# Patient Record
Sex: Female | Born: 1995 | Race: White | Hispanic: No | Marital: Single | Smoking: Former smoker
Health system: Southern US, Community
[De-identification: ages and names within clinical notes are randomized; demographics above are authoritative.]

## PROBLEM LIST (undated history)

## (undated) DIAGNOSIS — A749 Chlamydial infection, unspecified: Secondary | ICD-10-CM

## (undated) DIAGNOSIS — E049 Nontoxic goiter, unspecified: Secondary | ICD-10-CM

## (undated) DIAGNOSIS — F99 Mental disorder, not otherwise specified: Secondary | ICD-10-CM

## (undated) DIAGNOSIS — E063 Autoimmune thyroiditis: Secondary | ICD-10-CM

## (undated) DIAGNOSIS — F3281 Premenstrual dysphoric disorder: Secondary | ICD-10-CM

## (undated) DIAGNOSIS — F32A Depression, unspecified: Secondary | ICD-10-CM

## (undated) DIAGNOSIS — N946 Dysmenorrhea, unspecified: Secondary | ICD-10-CM

## (undated) DIAGNOSIS — S42309A Unspecified fracture of shaft of humerus, unspecified arm, initial encounter for closed fracture: Secondary | ICD-10-CM

## (undated) DIAGNOSIS — F329 Major depressive disorder, single episode, unspecified: Secondary | ICD-10-CM

## (undated) HISTORY — DX: Dysmenorrhea, unspecified: N94.6

## (undated) HISTORY — DX: Premenstrual dysphoric disorder: F32.81

## (undated) HISTORY — DX: Autoimmune thyroiditis: E06.3

## (undated) HISTORY — PX: WISDOM TOOTH EXTRACTION: SHX21

## (undated) HISTORY — DX: Unspecified fracture of shaft of humerus, unspecified arm, initial encounter for closed fracture: S42.309A

## (undated) HISTORY — DX: Nontoxic goiter, unspecified: E04.9

## (undated) HISTORY — DX: Chlamydial infection, unspecified: A74.9

## (undated) HISTORY — DX: Mental disorder, not otherwise specified: F99

---
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
## 2015-07-26 ENCOUNTER — Encounter (HOSPITAL_COMMUNITY): Payer: Self-pay

## 2015-07-26 ENCOUNTER — Emergency Department (HOSPITAL_COMMUNITY): Payer: PRIVATE HEALTH INSURANCE

## 2015-07-26 DIAGNOSIS — Z8742 Personal history of other diseases of the female genital tract: Secondary | ICD-10-CM | POA: Insufficient documentation

## 2015-07-26 DIAGNOSIS — F17210 Nicotine dependence, cigarettes, uncomplicated: Secondary | ICD-10-CM | POA: Diagnosis not present

## 2015-07-26 DIAGNOSIS — Z8781 Personal history of (healed) traumatic fracture: Secondary | ICD-10-CM | POA: Diagnosis not present

## 2015-07-26 DIAGNOSIS — M542 Cervicalgia: Secondary | ICD-10-CM | POA: Insufficient documentation

## 2015-07-26 DIAGNOSIS — J9801 Acute bronchospasm: Secondary | ICD-10-CM | POA: Diagnosis not present

## 2015-07-26 DIAGNOSIS — Z8619 Personal history of other infectious and parasitic diseases: Secondary | ICD-10-CM | POA: Insufficient documentation

## 2015-07-26 DIAGNOSIS — Z8639 Personal history of other endocrine, nutritional and metabolic disease: Secondary | ICD-10-CM | POA: Diagnosis not present

## 2015-07-26 DIAGNOSIS — Z8659 Personal history of other mental and behavioral disorders: Secondary | ICD-10-CM | POA: Diagnosis not present

## 2015-07-26 DIAGNOSIS — Z79899 Other long term (current) drug therapy: Secondary | ICD-10-CM | POA: Diagnosis not present

## 2015-07-26 DIAGNOSIS — M549 Dorsalgia, unspecified: Secondary | ICD-10-CM | POA: Insufficient documentation

## 2015-07-26 DIAGNOSIS — R079 Chest pain, unspecified: Secondary | ICD-10-CM | POA: Diagnosis present

## 2015-07-26 LAB — BASIC METABOLIC PANEL WITH GFR
Anion gap: 10 (ref 5–15)
BUN: 7 mg/dL (ref 6–20)
CO2: 25 mmol/L (ref 22–32)
Calcium: 9.5 mg/dL (ref 8.9–10.3)
Chloride: 102 mmol/L (ref 101–111)
Creatinine, Ser: 0.65 mg/dL (ref 0.44–1.00)
GFR calc Af Amer: >60 mL/min (ref >60)
GFR calc non Af Amer: >60 mL/min (ref >60)
Glucose, Bld: 77 mg/dL (ref 65–99)
Potassium: 3.4 mmol/L — ABNORMAL LOW (ref 3.5–5.1)
Sodium: 137 mmol/L (ref 135–145)

## 2015-07-26 LAB — I-STAT TROPONIN, ED
Troponin i, poc: 0 ng/mL (ref 0.00–0.08)
Troponin i, poc: 0 ng/mL (ref 0.00–0.08)

## 2015-07-26 LAB — CBC
HCT: 38 % (ref 36.0–46.0)
Hemoglobin: 12.7 g/dL (ref 12.0–15.0)
MCH: 31.1 pg (ref 26.0–34.0)
MCHC: 33.4 g/dL (ref 30.0–36.0)
MCV: 93.1 fL (ref 78.0–100.0)
Platelets: 163 10*3/uL (ref 150–400)
RBC: 4.08 MIL/uL (ref 3.87–5.11)
RDW: 12.6 % (ref 11.5–15.5)
WBC: 7.6 10*3/uL (ref 4.0–10.5)

## 2015-07-26 NOTE — ED Notes (Signed)
Pt reports was seen at u/c 2 days ago for sinus, sore throat, fatigue, mild cough, chest pain and back pain.  Pt was given antibiotic but did not take because she was feeling better until today.  Pt still with sore throat, chest pain and back pain.

## 2015-07-27 ENCOUNTER — Emergency Department (HOSPITAL_COMMUNITY)
Admission: EM | Admit: 2015-07-27 | Discharge: 2015-07-27 | Disposition: A | Discharge status: Home / Self Care | Payer: PRIVATE HEALTH INSURANCE | Attending: Emergency Medicine | Admitting: Emergency Medicine

## 2015-07-27 DIAGNOSIS — J9801 Acute bronchospasm: Secondary | ICD-10-CM

## 2015-07-27 DIAGNOSIS — R079 Chest pain, unspecified: Secondary | ICD-10-CM

## 2015-07-27 LAB — D-DIMER, QUANTITATIVE: D-Dimer, Quant: 0.32 ug/mL-FEU (ref 0.00–0.50)

## 2015-07-27 MED ORDER — ALBUTEROL SULFATE HFA 108 (90 BASE) MCG/ACT IN AERS: 2.0000 | INHALATION_SPRAY | RESPIRATORY_TRACT | Status: AC | PRN

## 2015-07-27 MED ORDER — TRAMADOL HCL 50 MG PO TABS: 50.0000 mg | ORAL_TABLET | Freq: Four times a day (QID) | ORAL | Status: AC | PRN

## 2015-07-27 MED ORDER — PREDNISONE 20 MG PO TABS: 60.0000 mg | ORAL_TABLET | Freq: Every day | ORAL | Status: AC

## 2015-07-27 NOTE — ED Provider Notes (Signed)
CSN: [id number]     Arrival date & time 07/26/15  2154 History  By signing my name below, I, Doreatha Martin, attest that this documentation has been prepared under the direction and in the presence of Gilda Crease, MD. Electronically Signed: Doreatha Martin, ED Scribe. 07/27/2015. 1:13 AM.    Chief Complaint  Patient presents with  . Chest Pain   The history is provided by the patient. No language interpreter was used.   HPI Comments: Allison Dickson is a 20 y.o. female who presents to the Emergency Department complaining of left-sided and substernal CP onset this week with associated upper back and neck pain, sore throat. Pt states that her CP is worsened with deep inhalation and movement. No known trauma, falls or injury to the chest wall. She was seen at UC 2 days ago for the same symptoms and was prescribed Amoxicillin. Per pt, she did not take the medication because she believed her symptoms were improving. She denies cough, SOB, difficulty tolerating secretions. No h/o asthma. Pt is a current daily smoker.   Past Medical History  Diagnosis Date  . Mental disorder     depression and anxiety  . Vitamin D deficiency   . Dysmenorrhea   . Chlamydia   . Arm fracture     right arm  . Goiter   . PMDD (premenstrual dysphoric disorder)   . Hashimoto's disease    Past Surgical History  Procedure Laterality Date  . Wisdom tooth extraction     Family History  Problem Relation Age of Onset  . Hypertension Father   . Thyroid disease Mother    Social History  Substance Use Topics  . Smoking status: Current Every Day Smoker    Types: Cigarettes  . Smokeless tobacco: None     Comment: 1/2 pack a day  . Alcohol Use: No   OB History    Gravida Para Term Preterm AB TAB SAB Ectopic Multiple Living   0         0     Review of Systems  HENT: Positive for sore throat.   Respiratory: Negative for cough and shortness of breath.   Cardiovascular: Positive for chest pain.   Musculoskeletal: Positive for back pain and neck pain.  All other systems reviewed and are negative.  Allergies  Review of patient's allergies indicates no known allergies.  Home Medications   Prior to Admission medications   Medication Sig Start Date End Date Taking? Authorizing Provider  acetaminophen (TYLENOL) 325 MG tablet Take 650 mg by mouth every 6 (six) hours as needed for mild pain.   Yes Historical Provider, MD  sertraline (ZOLOFT) 100 MG tablet Take 100 mg by mouth daily.   Yes Historical Provider, MD  albuterol (PROVENTIL HFA;VENTOLIN HFA) 108 (90 Base) MCG/ACT inhaler Inhale 2 puffs into the lungs every 4 (four) hours as needed for wheezing or shortness of breath. 07/27/15   Gilda Crease, MD  etonogestrel (IMPLANON) 68 MG IMPL implant Inject 1 each (68 mg total) into the skin once. Patient not taking: Reported on 07/27/2015 07/30/11 07/27/15  Henreitta Leber, PA-C  predniSONE (DELTASONE) 20 MG tablet Take 3 tablets (60 mg total) by mouth daily with breakfast. 07/27/15   Gilda Crease, MD  traMADol (ULTRAM) 50 MG tablet Take 1 tablet (50 mg total) by mouth every 6 (six) hours as needed. 07/27/15   Gilda Crease, MD   BP 117/82 mmHg  Pulse 93  Temp(Src) 98.3 F (36.8 C) (  Oral)  Resp 17  Ht 5\' 6"  (1.676 m)  Wt 165 lb (74.844 kg)  BMI 26.64 kg/m2  SpO2 100%  LMP 06/29/2015 Physical Exam  Constitutional: She is oriented to person, place, and time. She appears well-developed and well-nourished. No distress.  HENT:  Head: Normocephalic and atraumatic.  Right Ear: Hearing normal.  Left Ear: Hearing normal.  Nose: Nose normal.  Mouth/Throat: Oropharynx is clear and moist and mucous membranes are normal.  Eyes: Conjunctivae and EOM are normal. Pupils are equal, round, and reactive to light.  Neck: Normal range of motion. Neck supple.  Cardiovascular: Regular rhythm.  Exam reveals no gallop and no friction rub.   No murmur heard. Pulmonary/Chest: Effort  normal. No respiratory distress. She exhibits no tenderness.  Decreased breath sounds bilaterally. No wheezing appreciated.   Abdominal: Soft. Normal appearance and bowel sounds are normal. There is no hepatosplenomegaly. There is no tenderness. There is no rebound, no guarding, no tenderness at McBurney's point and negative Murphy's sign. No hernia.  Musculoskeletal: Normal range of motion.  Neurological: She is alert and oriented to person, place, and time. She has normal strength. No cranial nerve deficit or sensory deficit. Coordination normal. GCS eye subscore is 4. GCS verbal subscore is 5. GCS motor subscore is 6.  Skin: Skin is warm and dry. No rash noted.  Psychiatric: She has a normal mood and affect. Her behavior is normal. Thought content normal.  Nursing note and vitals reviewed.   ED Course  Procedures (including critical care time) DIAGNOSTIC STUDIES: Oxygen Saturation is 100% on RA, normal by my interpretation.    COORDINATION OF CARE: 1:11 AM Discussed treatment plan with pt at bedside which includes CXR, EKG, lab work and pt agreed to plan.   Labs Review Labs Reviewed  BASIC METABOLIC PANEL - Abnormal; Notable for the following:    Potassium 3.4 (*)    All other components within normal limits  CBC  D-DIMER, QUANTITATIVE (NOT AT [facility])  I-STAT TROPOININ, ED  I-STAT TROPOININ, ED    Imaging Review Dg Chest 2 View  07/26/2015  CLINICAL DATA:  Acute onset of sore throat, fatigue, mild cough and generalized chest pain. Back pain. Initial encounter. EXAM: CHEST  2 VIEW COMPARISON:  None. FINDINGS: The lungs are well-aerated and clear. There is no evidence of focal opacification, pleural effusion or pneumothorax. The heart is normal in size; the mediastinal contour is within normal limits. No acute osseous abnormalities are seen. IMPRESSION: No acute cardiopulmonary process seen. Electronically Signed   By: Roanna RaiderJeffery  Chang M.D.   On: 07/26/2015 23:50   I have personally  reviewed and evaluated these images and lab results as part of my medical decision-making.   EKG Interpretation   Date/Time:  Saturday Jul 26 2015 22:00:06 EDT Ventricular Rate:  104 PR Interval:  138 QRS Duration: 80 QT Interval:  316 QTC Calculation: 415 R Axis:   68 Text Interpretation:  Sinus tachycardia Otherwise normal ECG Confirmed by  Lona Six  MD, Kyley Laurel ([id number]) on 07/27/2015 1:15:23 AM      MDM   Final diagnoses:  Chest pain, unspecified chest pain type  Bronchospasm   Patient presents to emergency for evaluation of chest pain. Pain goes into her back at times and worsens when she takes a deep breath. She has, however, also noticed pain with movements. She was seen at urgent care several days ago and started on amoxicillin for presumed upper respiratory infection. She has not had much coughing, but she did  have significantly diminished breath sounds here in the ER. She is a smoker and I suspect that there is some bronchospastic component to the symptoms. She is not hypoxic. She was mildly tachycardic on arrival, PE was considered although felt to be less likely than other etiology. D-dimer was negative which is felt to be good evidence that she is not experiencing probable embolic event at this time and patient will be treated with prednisone, albuterol, analgesia and rest.   I personally performed the services described in this documentation, which was scribed in my presence. The recorded information has been reviewed and is accurate.   Gilda Crease, MD 07/27/15 [phone]

## 2015-07-27 NOTE — Discharge Instructions (Signed)
Bronchospasm, Adult  A bronchospasm is a spasm or tightening of the airways going into the lungs. During a bronchospasm breathing becomes more difficult because the airways get smaller. When this happens there can be coughing, a whistling sound when breathing (wheezing), and difficulty breathing. Bronchospasm is often associated with asthma, but not all patients who experience a bronchospasm have asthma.  CAUSES   A bronchospasm is caused by inflammation or irritation of the airways. The inflammation or irritation may be triggered by:   · Allergies (such as to animals, pollen, food, or mold). Allergens that cause bronchospasm may cause wheezing immediately after exposure or many hours later.    · Infection. Viral infections are believed to be the most common cause of bronchospasm.    · Exercise.    · Irritants (such as pollution, cigarette smoke, strong odors, aerosol sprays, and paint fumes).    · Weather changes. Winds increase molds and pollens in the air. Rain refreshes the air by washing irritants out. Cold air may cause inflammation.    · Stress and emotional upset.    SIGNS AND SYMPTOMS   · Wheezing.    · Excessive nighttime coughing.    · Frequent or severe coughing with a simple cold.    · Chest tightness.    · Shortness of breath.    DIAGNOSIS   Bronchospasm is usually diagnosed through a history and physical exam. Tests, such as chest X-rays, are sometimes done to look for other conditions.  TREATMENT   · Inhaled medicines can be given to open up your airways and help you breathe. The medicines can be given using either an inhaler or a nebulizer machine.  · Corticosteroid medicines may be given for severe bronchospasm, usually when it is associated with asthma.  HOME CARE INSTRUCTIONS   · Always have a plan prepared for seeking medical care. Know when to call your health care provider and local emergency services (911 in the [place] Know where you can access local emergency care.  · Only take medicines as  directed by your health care provider.  · If you were prescribed an inhaler or nebulizer machine, ask your health care provider to explain how to use it correctly. Always use a spacer with your inhaler if you were given one.  · It is necessary to remain calm during an attack. Try to relax and breathe more slowly.   · Control your home environment in the following ways:      Change your heating and air conditioning filter at least once a month.      Limit your use of fireplaces and wood stoves.    Do not smoke and do not allow smoking in your home.      Avoid exposure to perfumes and fragrances.      Get rid of pests (such as roaches and mice) and their droppings.      Throw away plants if you see mold on them.      Keep your house clean and dust free.      Replace carpet with wood, tile, or vinyl flooring. Carpet can trap dander and dust.      Use allergy-proof pillows, mattress covers, and box spring covers.      Wash bed sheets and blankets every week in hot water and dry them in a dryer.      Use blankets that are made of polyester or cotton.      Wash hands frequently.  SEEK MEDICAL CARE IF:   · You have muscle aches.    · You have chest pain.    · The sputum changes from clear or   white to yellow, green, gray, or bloody.    · The sputum you cough up gets thicker.    · There are problems that may be related to the medicine you are given, such as a rash, itching, swelling, or trouble breathing.    SEEK IMMEDIATE MEDICAL CARE IF:   · You have worsening wheezing and coughing even after taking your prescribed medicines.    · You have increased difficulty breathing.    · You develop severe chest pain.  MAKE SURE YOU:   · Understand these instructions.  · Will watch your condition.  · Will get help right away if you are not doing well or get worse.     This information is not intended to replace advice given to you by your health care provider. Make sure you discuss any questions you have with your health care  provider.     Document Released: 02/25/2003 Document Revised: 03/15/2014 Document Reviewed: 08/14/2012  Elsevier Interactive Patient Education ©2016 [employer]

## 2015-12-05 ENCOUNTER — Encounter (HOSPITAL_COMMUNITY): Payer: Self-pay | Admitting: Emergency Medicine

## 2015-12-05 ENCOUNTER — Emergency Department (HOSPITAL_COMMUNITY)
Admission: EM | Admit: 2015-12-05 | Discharge: 2015-12-05 | Disposition: A | Discharge status: Home / Self Care | Payer: PRIVATE HEALTH INSURANCE | Attending: Emergency Medicine | Admitting: Emergency Medicine

## 2015-12-05 DIAGNOSIS — Y999 Unspecified external cause status: Secondary | ICD-10-CM | POA: Diagnosis not present

## 2015-12-05 DIAGNOSIS — S199XXA Unspecified injury of neck, initial encounter: Secondary | ICD-10-CM | POA: Diagnosis present

## 2015-12-05 DIAGNOSIS — F17210 Nicotine dependence, cigarettes, uncomplicated: Secondary | ICD-10-CM | POA: Insufficient documentation

## 2015-12-05 DIAGNOSIS — Y92410 Unspecified street and highway as the place of occurrence of the external cause: Secondary | ICD-10-CM | POA: Insufficient documentation

## 2015-12-05 DIAGNOSIS — Y939 Activity, unspecified: Secondary | ICD-10-CM | POA: Insufficient documentation

## 2015-12-05 DIAGNOSIS — S161XXA Strain of muscle, fascia and tendon at neck level, initial encounter: Secondary | ICD-10-CM | POA: Diagnosis not present

## 2015-12-05 HISTORY — DX: Major depressive disorder, single episode, unspecified: F32.9

## 2015-12-05 HISTORY — DX: Depression, unspecified: F32.A

## 2015-12-05 MED ORDER — IBUPROFEN 800 MG PO TABS: 800.0000 mg | ORAL_TABLET | Freq: Three times a day (TID) | ORAL | 0 refills | Status: AC

## 2015-12-05 MED ORDER — CYCLOBENZAPRINE HCL 10 MG PO TABS: 10.0000 mg | ORAL_TABLET | Freq: Two times a day (BID) | ORAL | 0 refills | Status: AC | PRN

## 2015-12-05 NOTE — ED Notes (Signed)
Pt verbalized understanding of d/c instructions and no further questions. Pt sent home with cervical soft collar. Pt ambulatory and NAD. VSS
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
## 2015-12-05 NOTE — ED Notes (Signed)
Called Pt for room. No Response.

## 2017-03-19 IMAGING — CR DG CHEST 2V
2 series · 2 of 2 positions shown · non-contrast
Comparison: None.

CLINICAL DATA: Acute onset of sore throat, fatigue, mild cough and
generalized chest pain. Back pain. Initial encounter.

EXAM:
CHEST  2 VIEW

[chest pa]
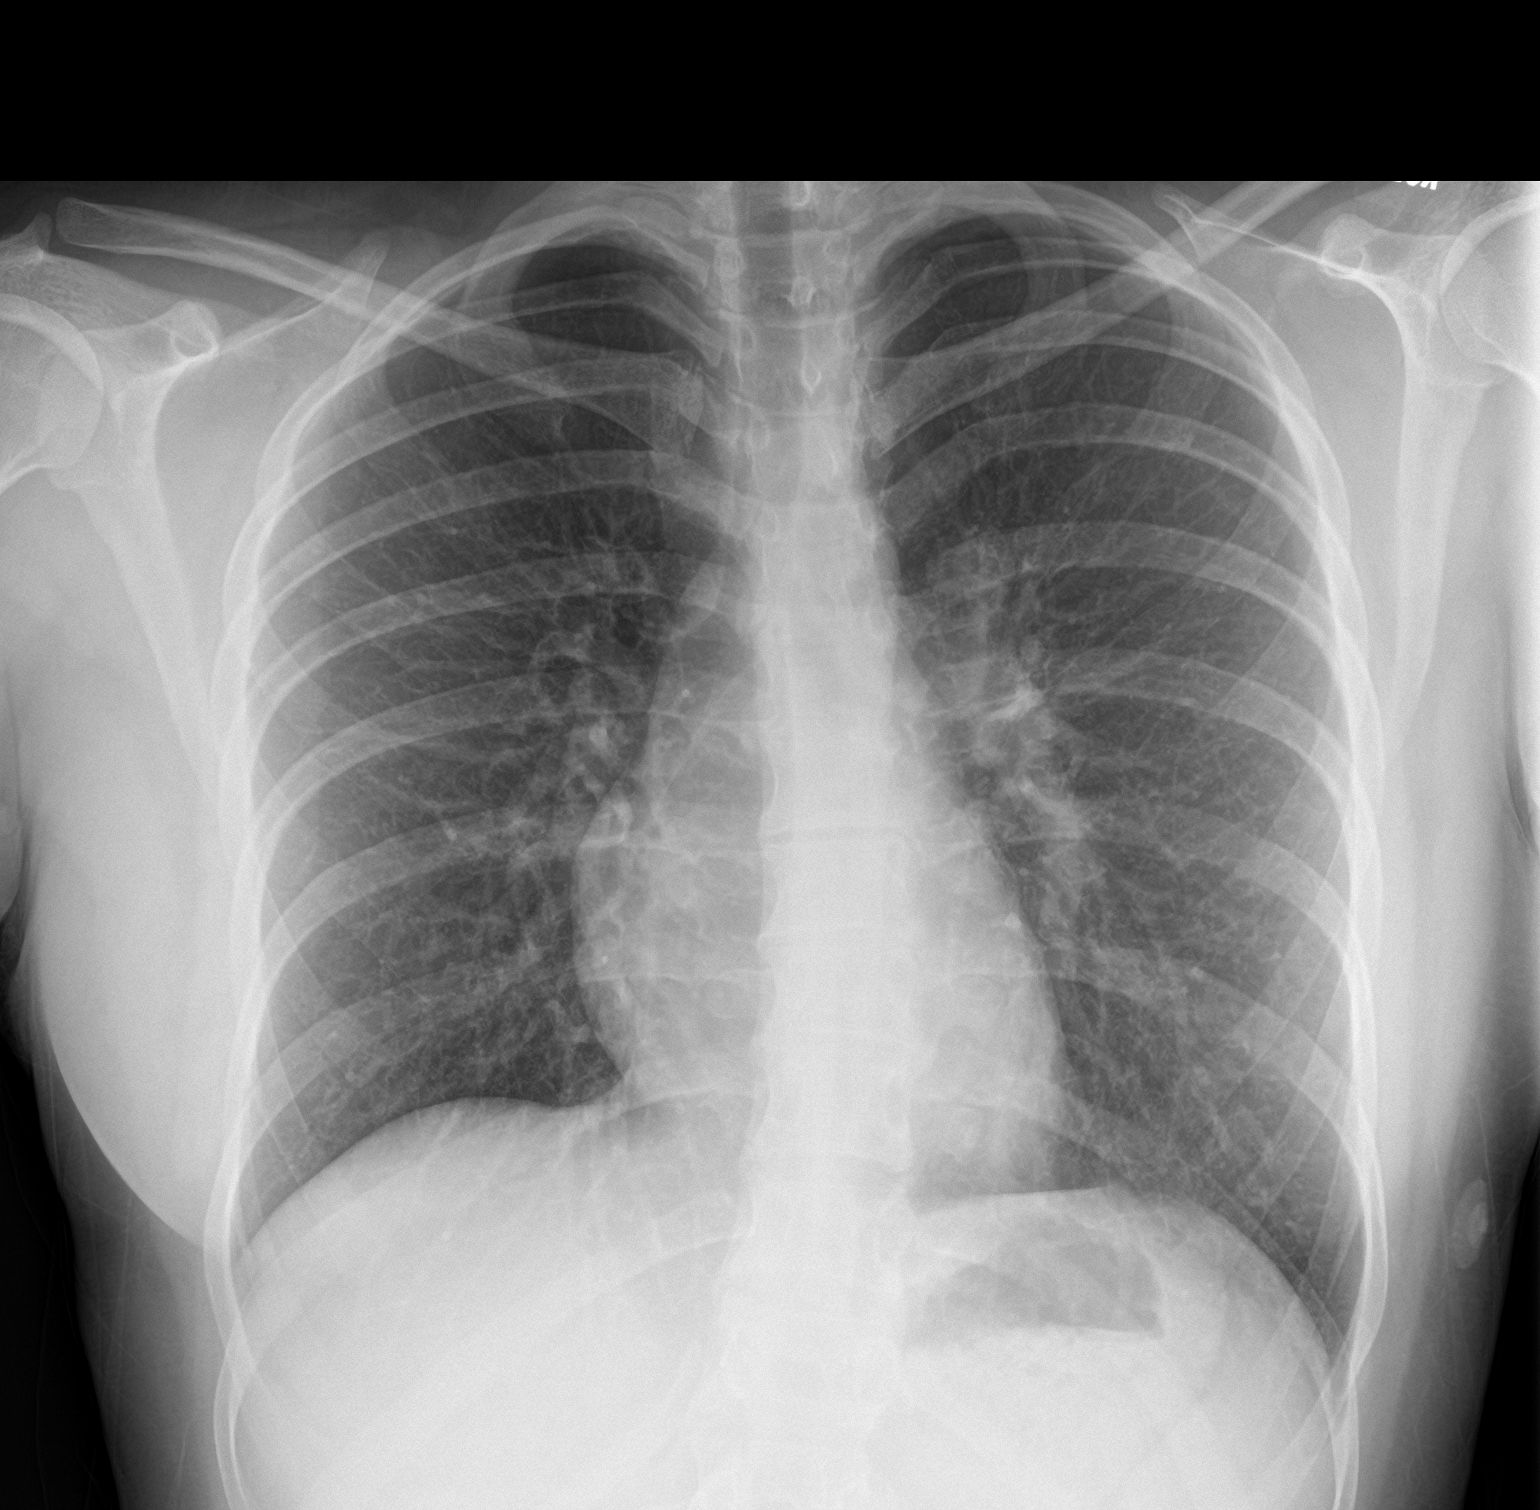

[chest lat]
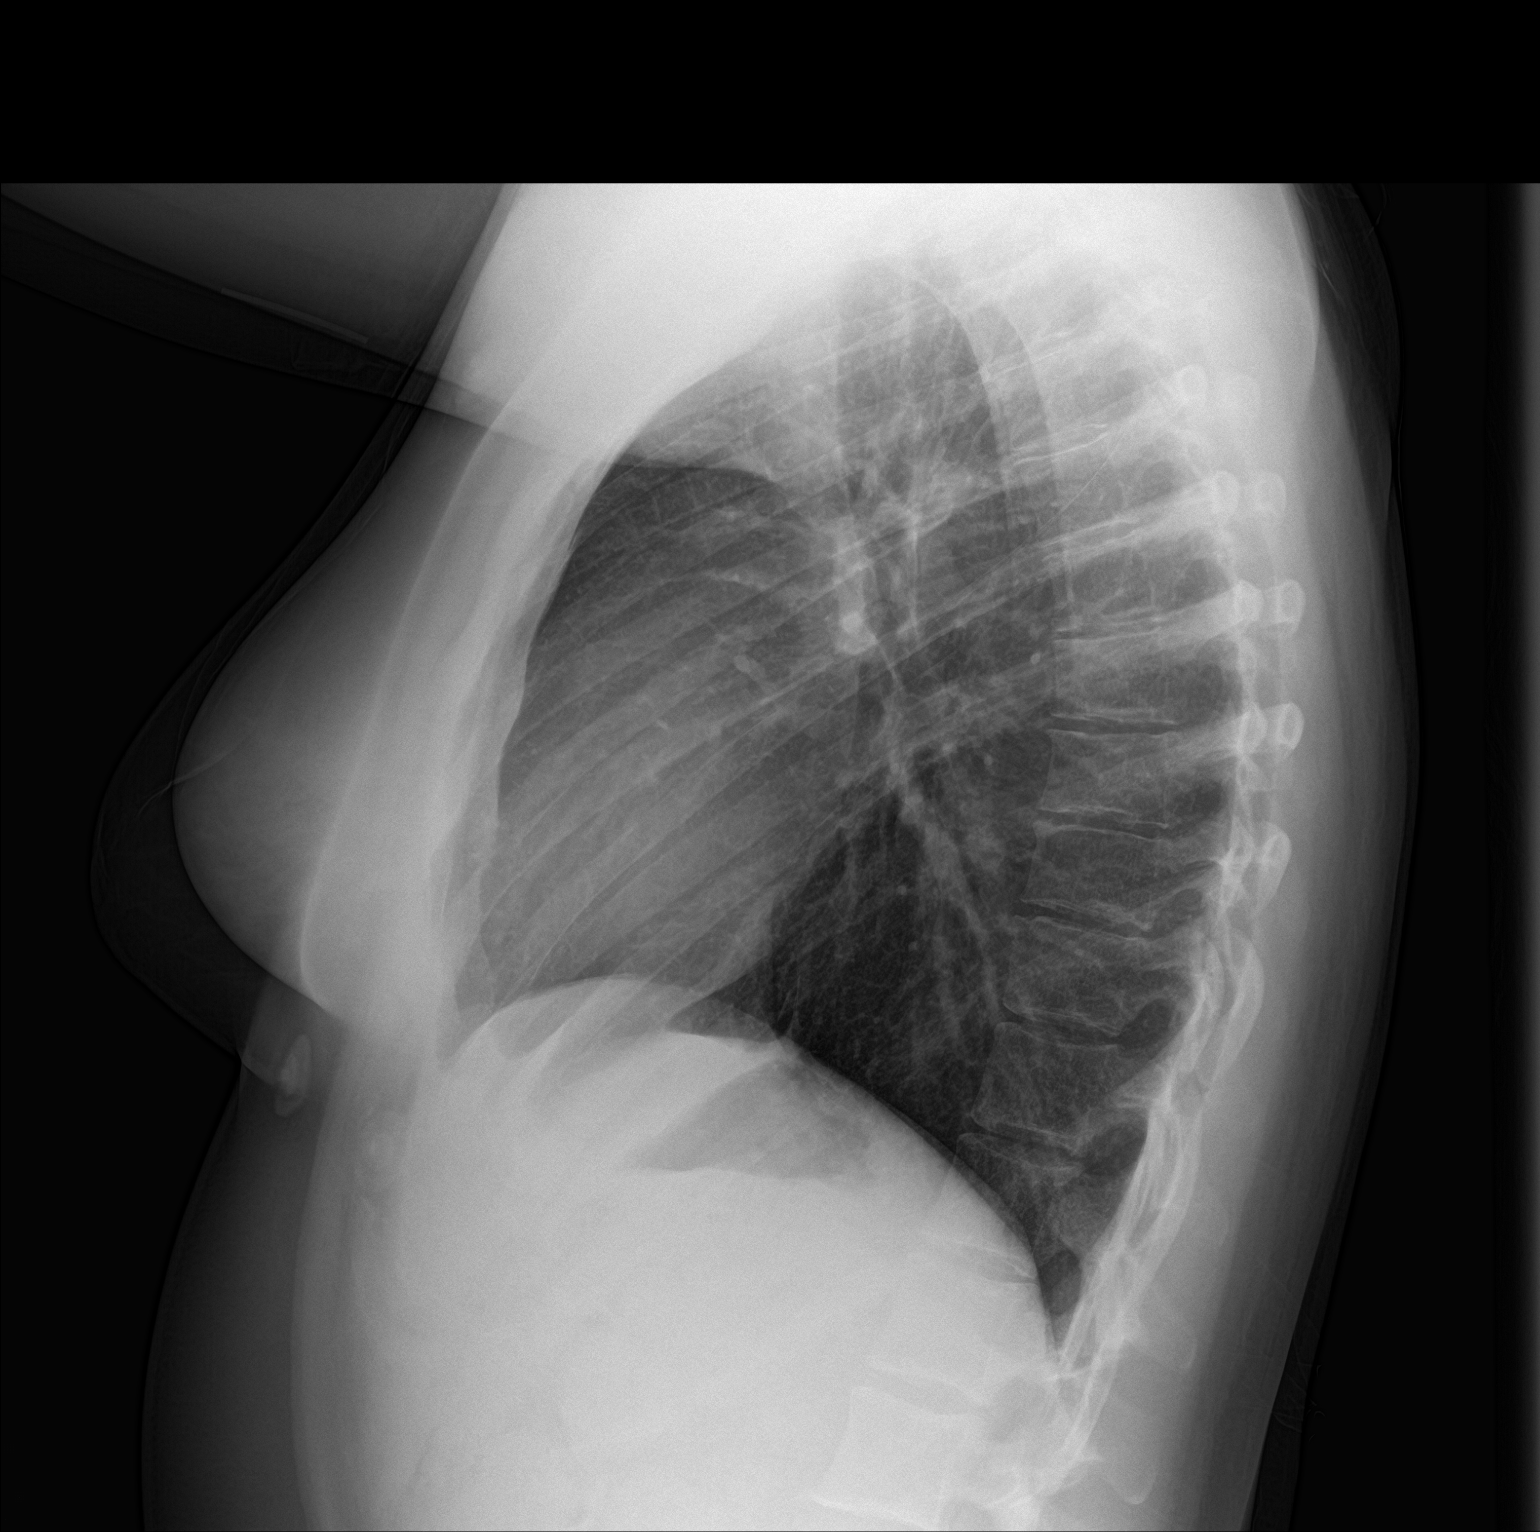

[2 of 2 positions shown; findings below may reference images not displayed]

FINDINGS: The lungs are well-aerated and clear. There is no evidence of focal
opacification, pleural effusion or pneumothorax.

The heart is normal in size; the mediastinal contour is within
normal limits. No acute osseous abnormalities are seen.
IMPRESSION: No acute cardiopulmonary process seen.

## 2017-03-22 DIAGNOSIS — Z13220 Encounter for screening for lipoid disorders: Secondary | ICD-10-CM | POA: Diagnosis not present

## 2017-03-22 DIAGNOSIS — F339 Major depressive disorder, recurrent, unspecified: Secondary | ICD-10-CM | POA: Diagnosis not present

## 2017-03-22 DIAGNOSIS — R5383 Other fatigue: Secondary | ICD-10-CM | POA: Diagnosis not present

## 2017-03-22 DIAGNOSIS — E559 Vitamin D deficiency, unspecified: Secondary | ICD-10-CM | POA: Diagnosis not present

## 2017-03-22 DIAGNOSIS — F411 Generalized anxiety disorder: Secondary | ICD-10-CM | POA: Diagnosis not present

## 2017-03-22 DIAGNOSIS — Z8639 Personal history of other endocrine, nutritional and metabolic disease: Secondary | ICD-10-CM | POA: Diagnosis not present

## 2017-03-22 DIAGNOSIS — Z131 Encounter for screening for diabetes mellitus: Secondary | ICD-10-CM | POA: Diagnosis not present

## 2017-06-29 DIAGNOSIS — Z01419 Encounter for gynecological examination (general) (routine) without abnormal findings: Secondary | ICD-10-CM | POA: Diagnosis not present

## 2017-06-29 DIAGNOSIS — Z113 Encounter for screening for infections with a predominantly sexual mode of transmission: Secondary | ICD-10-CM | POA: Diagnosis not present

## 2017-06-29 DIAGNOSIS — Z6829 Body mass index (BMI) 29.0-29.9, adult: Secondary | ICD-10-CM | POA: Diagnosis not present

## 2017-06-29 DIAGNOSIS — N760 Acute vaginitis: Secondary | ICD-10-CM | POA: Diagnosis not present

## 2017-11-15 DIAGNOSIS — E559 Vitamin D deficiency, unspecified: Secondary | ICD-10-CM | POA: Diagnosis not present

## 2017-11-15 DIAGNOSIS — E7800 Pure hypercholesterolemia, unspecified: Secondary | ICD-10-CM | POA: Diagnosis not present

## 2017-11-15 DIAGNOSIS — R5383 Other fatigue: Secondary | ICD-10-CM | POA: Diagnosis not present

## 2017-11-15 DIAGNOSIS — F339 Major depressive disorder, recurrent, unspecified: Secondary | ICD-10-CM | POA: Diagnosis not present

## 2017-11-15 DIAGNOSIS — F411 Generalized anxiety disorder: Secondary | ICD-10-CM | POA: Diagnosis not present

## 2020-03-14 ENCOUNTER — Other Ambulatory Visit: Payer: PRIVATE HEALTH INSURANCE

## 2022-08-24 ENCOUNTER — Other Ambulatory Visit: Payer: Self-pay

## 2022-08-24 ENCOUNTER — Encounter (HOSPITAL_BASED_OUTPATIENT_CLINIC_OR_DEPARTMENT_OTHER): Payer: Self-pay

## 2022-08-24 ENCOUNTER — Emergency Department (HOSPITAL_BASED_OUTPATIENT_CLINIC_OR_DEPARTMENT_OTHER)
Admission: EM | Admit: 2022-08-24 | Discharge: 2022-08-24 | Disposition: A | Discharge status: Home / Self Care | Payer: PRIVATE HEALTH INSURANCE | Attending: Emergency Medicine | Admitting: Emergency Medicine

## 2022-08-24 DIAGNOSIS — Z203 Contact with and (suspected) exposure to rabies: Secondary | ICD-10-CM

## 2022-08-24 DIAGNOSIS — Z2914 Encounter for prophylactic rabies immune globin: Secondary | ICD-10-CM | POA: Insufficient documentation

## 2022-08-24 DIAGNOSIS — Z23 Encounter for immunization: Secondary | ICD-10-CM | POA: Diagnosis not present

## 2022-08-24 MED ORDER — RABIES VACCINE, PCEC IM SUSR
1.0000 mL | Freq: Once | INTRAMUSCULAR | Status: AC
  Administered 2022-08-24: 1 mL via INTRAMUSCULAR
  Filled 2022-08-24: qty 1

## 2022-08-24 MED ORDER — RABIES IMMUNE GLOBULIN 150 UNIT/ML IM INJ
20.0000 [IU]/kg | INJECTION | Freq: Once | INTRAMUSCULAR | Status: AC
  Administered 2022-08-24: 1650 [IU] via INTRAMUSCULAR
  Filled 2022-08-24: qty 12

## 2022-08-24 NOTE — ED Notes (Signed)
RN reviewed discharge instructions with pt. Pt verbalized understanding and had no further questions 

## 2022-08-24 NOTE — Discharge Instructions (Signed)
You received rabies vaccination today.  You will need repeat doses on day 3, 7, 14.  If any concerning symptoms return to the emergency room.  You can go to an urgent care or your primary care provider to have these done.

## 2022-08-24 NOTE — ED Triage Notes (Signed)
Patient here POV from Home.  Endorses catching a Bat today in her Home. Seeks Rabies Prevention.  NAD Noted during triage. A&Ox4. GCS 15. Ambulatory.

## 2022-08-24 NOTE — ED Provider Notes (Signed)
[place] EMERGENCY DEPARTMENT AT [facility] Provider Note   CSN: [id number] Arrival date & time: 08/24/22  1440     History  Chief Complaint  Patient presents with   Rabies Injection    Allison Dickson is a 27 y.o. female.  27 year old female presents today for concern of exposure to rabies.  She states there was a bat in her room overnight.  She was able to remove the bat from her home.  She is unsure if she was bit however she was sleep most of the night with a bat in her room.  No symptoms.  The history is provided by the patient. No language interpreter was used.       Home Medications Prior to Admission medications   Medication Sig Start Date End Date Taking? Authorizing Provider  acetaminophen (TYLENOL) 325 MG tablet Take 650 mg by mouth every 6 (six) hours as needed for mild pain.    [provider]  albuterol (PROVENTIL HFA;VENTOLIN HFA) 108 (90 Base) MCG/ACT inhaler Inhale 2 puffs into the lungs every 4 (four) hours as needed for wheezing or shortness of breath. 07/27/15   Pollina, Canary Brim, MD  cyclobenzaprine (FLEXERIL) 10 MG tablet Take 1 tablet (10 mg total) by mouth 2 (two) times daily as needed for muscle spasms. 12/05/15   Fayrene Helper, PA-C  etonogestrel (IMPLANON) 68 MG IMPL implant Inject 1 each (68 mg total) into the skin once. Patient not taking: Reported on 07/27/2015 07/30/11 07/27/15  Henreitta Leber, PA-C  ibuprofen (ADVIL,MOTRIN) 800 MG tablet Take 1 tablet (800 mg total) by mouth 3 (three) times daily. 12/05/15   Fayrene Helper, PA-C  predniSONE (DELTASONE) 20 MG tablet Take 3 tablets (60 mg total) by mouth daily with breakfast. 07/27/15   Gilda Crease, MD  sertraline (ZOLOFT) 100 MG tablet Take 100 mg by mouth daily.    [provider]  traMADol (ULTRAM) 50 MG tablet Take 1 tablet (50 mg total) by mouth every 6 (six) hours as needed. 07/27/15   Gilda Crease, MD      Allergies    Patient has no known  allergies.    Review of Systems   Review of Systems  Constitutional:  Negative for fever.  Skin:  Negative for wound.  All other systems reviewed and are negative.   Physical Exam Updated Vital Signs BP (!) 143/104 (BP Location: Right Arm)   Pulse 89   Temp (!) 97 F (36.1 C) (Temporal)   Resp 18   Ht 5\' 7"  (1.702 m)   Wt 83.9 kg   SpO2 100%   BMI 28.98 kg/m  Physical Exam Vitals and nursing note reviewed.  Constitutional:      General: She is not in acute distress.    Appearance: Normal appearance. She is not ill-appearing.  HENT:     Head: Normocephalic and atraumatic.     Nose: Nose normal.  Eyes:     Conjunctiva/sclera: Conjunctivae normal.  Pulmonary:     Effort: Pulmonary effort is normal. No respiratory distress.  Musculoskeletal:        General: No deformity.  Skin:    Findings: No rash.  Neurological:     Mental Status: She is alert.     ED Results / Procedures / Treatments   Labs (all labs ordered are listed, but only abnormal results are displayed) Labs Reviewed - No data to display  EKG None  Radiology No results found.  Procedures Procedures    Medications  Ordered in ED Medications  rabies immune globulin (HYPERRAB/KEDRAB) injection 1,650 Units (has no administration in time range)  rabies vaccine (RABAVERT) injection 1 mL (has no administration in time range)    ED Course/ Medical Decision Making/ A&P                             Medical Decision Making Risk Prescription drug management.   27 year old female presents today for rabies prophylaxis.  Had bat within the room overnight while she was.  She was able to catch and remove the bat from her room.  Will give rabies prophylaxis.  Discussed timing of when she will need additional doses.  Patient voices understanding and is in agreement with plan.  No other complaints.   Final Clinical Impression(s) / ED Diagnoses Final diagnoses:  Need for post exposure prophylaxis for rabies     Rx / DC Orders ED Discharge Orders     None         Marita Kansas, PA-C 08/24/22 1638    Terrilee Files, MD 08/24/22 2151
